# Patient Record
Sex: Male | Born: 1980 | Hispanic: Yes | Marital: Single
Health system: Southern US, Community
[De-identification: ages and names within clinical notes are randomized; demographics above are authoritative.]

---

## 2006-12-05 ENCOUNTER — Ambulatory Visit: Payer: Self-pay

## 2020-05-23 ENCOUNTER — Emergency Department
Admission: EM | Admit: 2020-05-23 | Discharge: 2020-05-23 | Disposition: A | Discharge status: Home / Self Care | Payer: Self-pay | Attending: Emergency Medicine | Admitting: Emergency Medicine

## 2020-05-23 ENCOUNTER — Emergency Department: Payer: Self-pay

## 2020-05-23 ENCOUNTER — Other Ambulatory Visit: Payer: Self-pay

## 2020-05-23 DIAGNOSIS — S62650B Nondisplaced fracture of medial phalanx of right index finger, initial encounter for open fracture: Secondary | ICD-10-CM

## 2020-05-23 DIAGNOSIS — S62650A Nondisplaced fracture of medial phalanx of right index finger, initial encounter for closed fracture: Secondary | ICD-10-CM | POA: Insufficient documentation

## 2020-05-23 DIAGNOSIS — Z23 Encounter for immunization: Secondary | ICD-10-CM | POA: Insufficient documentation

## 2020-05-23 DIAGNOSIS — W260XXA Contact with knife, initial encounter: Secondary | ICD-10-CM | POA: Insufficient documentation

## 2020-05-23 MED ORDER — CEPHALEXIN 500 MG PO CAPS: 1000.0000 mg | ORAL_CAPSULE | Freq: Two times a day (BID) | ORAL | 0 refills | Status: AC

## 2020-05-23 MED ORDER — OXYCODONE-ACETAMINOPHEN 5-325 MG PO TABS
1.0000 | ORAL_TABLET | Freq: Once | ORAL | Status: AC
  Administered 2020-05-23: 1 via ORAL
  Filled 2020-05-23: qty 1

## 2020-05-23 MED ORDER — TETANUS-DIPHTH-ACELL PERTUSSIS 5-2.5-18.5 LF-MCG/0.5 IM SUSP
0.5000 mL | Freq: Once | INTRAMUSCULAR | Status: AC
  Administered 2020-05-23: 0.5 mL via INTRAMUSCULAR
  Filled 2020-05-23: qty 0.5

## 2020-05-23 MED ORDER — SULFAMETHOXAZOLE-TRIMETHOPRIM 800-160 MG PO TABS: 1.0000 | ORAL_TABLET | Freq: Two times a day (BID) | ORAL | 0 refills | Status: AC

## 2020-05-23 MED ORDER — OXYCODONE-ACETAMINOPHEN 5-325 MG PO TABS: 1.0000 | ORAL_TABLET | Freq: Four times a day (QID) | ORAL | 0 refills | Status: AC | PRN

## 2020-05-23 MED ORDER — LIDOCAINE HCL (PF) 1 % IJ SOLN
10.0000 mL | Freq: Once | INTRAMUSCULAR | Status: AC
  Administered 2020-05-23: 10 mL
  Filled 2020-05-23: qty 10

## 2020-05-23 NOTE — ED Triage Notes (Signed)
Pt with lac to right index finger with electrical saw. Lac noted with mod amount of bleeding at this time.

## 2020-05-23 NOTE — ED Provider Notes (Signed)
[facility] Emergency Department Provider Note  ____________________________________________  Time seen: Approximately 8:27 PM  I have reviewed the triage vital signs and the nursing notes.   HISTORY  Chief Complaint Laceration    HPI Jose Thomas is a 39 y.o. male who presents the emergency department complaining of a injury to the index finger of the right hand.  Patient states that he was using a table saw when he accidentally cut his finger.  Patient states that the saw turned off, however the blade was still spinning when it made contact with the finger.  Patient had difficulty controlling the bleeding prior to arrival.  No bleeding or clotting disorders.  Patient has not attempted to move the finger since the injury.  He is unsure of his last tetanus shot.  Patient denies any other injury or complaint.         No past medical history on file.  There are no problems to display for this patient.     Prior to Admission medications   Medication Sig Start Date End Date Taking? Authorizing Provider  cephALEXin (KEFLEX) 500 MG capsule Take 2 capsules (1,000 mg total) by mouth 2 (two) times daily. 05/23/20   Cato Liburd, Delorise RoyalsJonathan D, PA-C  oxyCODONE-acetaminophen (PERCOCET/ROXICET) 5-325 MG tablet Take 1 tablet by mouth every 6 (six) hours as needed for severe pain. 05/23/20   Raynesha Tiedt, Delorise RoyalsJonathan D, PA-C  sulfamethoxazole-trimethoprim (BACTRIM DS) 800-160 MG tablet Take 1 tablet by mouth 2 (two) times daily. 05/23/20   Galen Malkowski, Delorise RoyalsJonathan D, PA-C    Allergies Patient has no allergy information on record.  No family history on file.  Social History Social History   Tobacco Use  . Smoking status: Not on file  Substance Use Topics  . Alcohol use: Not on file  . Drug use: Not on file     Review of Systems  Constitutional: No fever/chills Eyes: No visual changes. No discharge ENT: No upper respiratory complaints. Cardiovascular: no chest  pain. Respiratory: no cough. No SOB. Gastrointestinal: No abdominal pain.  No nausea, no vomiting.  No diarrhea.  No constipation. Musculoskeletal: Table saw injury to the index finger of the right hand Skin: Negative for rash, abrasions, lacerations, ecchymosis. Neurological: Negative for headaches, focal weakness or numbness. 10-point ROS otherwise negative.  ____________________________________________   PHYSICAL EXAM:  VITAL SIGNS: ED Triage Vitals  Enc Vitals Group     BP 05/23/20 2014 131/83     Pulse Rate 05/23/20 2014 70     Resp 05/23/20 2014 20     Temp 05/23/20 2014 98.5 F (36.9 C)     Temp src --      SpO2 05/23/20 2014 100 %     Weight 05/23/20 2018 150 lb (68 kg)     Height --      Head Circumference --      Peak Flow --      Pain Score 05/23/20 2018 8     Pain Loc --      Pain Edu? --      Excl. in GC? --      Constitutional: Alert and oriented. Well appearing and in no acute distress. Eyes: Conjunctivae are normal. PERRL. EOMI. Head: Atraumatic. ENT:      Ears:       Nose: No congestion/rhinnorhea.      Mouth/Throat: Mucous membranes are moist.  Neck: No stridor.    Cardiovascular: Normal rate, regular rhythm. Normal S1 and S2.  Good peripheral circulation. Respiratory:  Normal respiratory effort without tachypnea or retractions. Lungs CTAB. Good air entry to the bases with no decreased or absent breath sounds. Musculoskeletal: Full range of motion to all extremities. No gross deformities appreciated.  Visualization of the index finger of the right hand reveals ragged laceration that extends from the palmar to dorsal aspect of the finger.  This wraps around the lateral aspect of the finger.  Measures approximately 4 cm in length.  Bleeding is not controlled at this time.  Sensation intact.  No other visible injuries to the right hand.  Patient has superficial lacerations distal to main laceration as well as some small injury to the fingernail.  No loosening  of the fingernail at this time.  Exam after anesthesia reveals good extension and flexion of the digit. Neurologic:  Normal speech and language. No gross focal neurologic deficits are appreciated.  Skin:  Skin is warm, dry and intact. No rash noted. Psychiatric: Mood and affect are normal. Speech and behavior are normal. Patient exhibits appropriate insight and judgement.   ____________________________________________   LABS (all labs ordered are listed, but only abnormal results are displayed)  Labs Reviewed - No data to display ____________________________________________  EKG   ____________________________________________  RADIOLOGY I personally viewed and evaluated these images as part of my medical decision making, as well as reviewing the written report by the radiologist.  DG Finger Index Right  Result Date: 05/23/2020 CLINICAL DATA:  Status post trauma. EXAM: RIGHT INDEX FINGER 2+V COMPARISON:  None. FINDINGS: An acute fracture is seen involving the distal aspect of the middle phalanx of the second right finger. Lateral and volar displacement of a 5 mm fracture fragment is noted. There is no evidence of dislocation. An ill-defined soft tissue defect is seen along the lateral aspect of the distal right finger, adjacent to the previously noted fracture site. IMPRESSION: Acute fracture of the middle phalanx of the second right finger. Electronically Signed   By: Aram Candela M.D.   On: 05/23/2020 20:39    ____________________________________________    PROCEDURES  Procedure(s) performed:    Marland KitchenMarland KitchenLaceration Repair  Date/Time: 05/23/2020 10:28 PM Performed by: Racheal Patches, PA-C Authorized by: Racheal Patches, PA-C   Consent:    Consent obtained:  Verbal   Consent given by:  Patient   Risks discussed:  Pain, poor cosmetic result, poor wound healing, tendon damage, retained foreign body, need for additional repair, infection and nerve damage Anesthesia  (see MAR for exact dosages):    Anesthesia method:  Nerve block   Block location:  Index finger R hand   Block needle gauge:  27 G   Block anesthetic:  Lidocaine 1% w/o epi   Block technique:  Digital block   Block injection procedure:  Anatomic landmarks identified, introduced needle, negative aspiration for blood and incremental injection   Block outcome:  Anesthesia achieved Laceration details:    Location:  Finger   Finger location:  R index finger   Length (cm):  4 Repair type:    Repair type:  Complex Pre-procedure details:    Preparation:  Patient was prepped and draped in usual sterile fashion and imaging obtained to evaluate for foreign bodies Exploration:    Limited defect created (wound extended): yes     Hemostasis achieved with:  Tourniquet   Wound exploration: wound explored through full range of motion     Wound extent: underlying fracture     Wound extent: no foreign bodies/material noted, no muscle damage noted, no nerve  damage noted and no tendon damage noted     Contaminated: no   Treatment:    Area cleansed with:  Betadine and saline   Amount of cleaning:  Extensive   Irrigation solution:  Sterile saline   Irrigation volume:  1L   Irrigation method:  Syringe Skin repair:    Repair method:  Sutures   Suture size:  4-0   Suture material:  Nylon   Suture technique:  Running locked   Number of sutures:  1 Approximation:    Approximation:  Close Post-procedure details:    Dressing:  Splint for protection   Patient tolerance of procedure:  Tolerated well, no immediate complications Comments:     Edges of laceration were very ragged in nature and required trimming of ragged pieces of skin.  Running interlock suture placed with good approximation of wound.  Patient retains flexion and extension after procedure.  Sensation was present before digital block.  Unable to test sensation due to block after procedure.  Patient had finger tourniquet placed to assist in  cessation of bleeding.  After procedure tourniquet is removed, no return of bleeding.  Patient has good capillary refill after procedure.      Medications  Tdap (BOOSTRIX) injection 0.5 mL (0.5 mLs Intramuscular Given 05/23/20 2100)  oxyCODONE-acetaminophen (PERCOCET/ROXICET) 5-325 MG per tablet 1 tablet (1 tablet Oral Given 05/23/20 2059)  lidocaine (PF) (XYLOCAINE) 1 % injection 10 mL (10 mLs Infiltration Given 05/23/20 2102)     ____________________________________________   INITIAL IMPRESSION / ASSESSMENT AND PLAN / ED COURSE  Pertinent labs & imaging results that were available during my care of the patient were reviewed by me and considered in my medical decision making (see chart for details).  Review of the [place] CSRS was performed in accordance of the [facility] prior to dispensing any controlled drugs.           Patient's diagnosis is consistent with open fracture of the index finger.  Patient presented to the emergency department after sustaining an injury to the index finger from a table saw.  Tablesaw made contact with the bone causing a fracture.  Patient has good extension and flexion of the digit however.  No indication for ligament rupture/complete severing.  Patient had laceration sutured, splinted here in the emergency department as described above.  At this time we will place the patient on dual antibiotic coverage, pain medications and refer to hand surgery for ongoing management of open fracture.  I discussed at length return precautions.  Patient verbalizes understanding of same.  Patient will be discharged home with prescriptions for Keflex, Bactrim, Percocet. Patient is to follow up with hand surgery as needed or otherwise directed. Patient is given ED precautions to return to the ED for any worsening or new symptoms.     ____________________________________________  FINAL CLINICAL IMPRESSION(S) / ED DIAGNOSES  Final diagnoses:  Open nondisplaced fracture of middle  phalanx of right index finger, initial encounter      NEW MEDICATIONS STARTED DURING THIS VISIT:  ED Discharge Orders         Ordered    cephALEXin (KEFLEX) 500 MG capsule  2 times daily        05/23/20 2227    sulfamethoxazole-trimethoprim (BACTRIM DS) 800-160 MG tablet  2 times daily        05/23/20 2227    oxyCODONE-acetaminophen (PERCOCET/ROXICET) 5-325 MG tablet  Every 6 hours PRN        05/23/20 2227  This chart was dictated using voice recognition software/Dragon. Despite best efforts to proofread, errors can occur which can change the meaning. Any change was purely unintentional.    Racheal Patches, PA-C 05/23/20 2243    Minna Antis, MD 05/23/20 2326

## 2021-11-02 IMAGING — DX DG FINGER INDEX 2+V*R*
3 series · 3 of 3 positions shown · non-contrast
Comparison: None.

CLINICAL DATA: Status post trauma.

EXAM:
RIGHT INDEX FINGER 2+V

[finger ap]
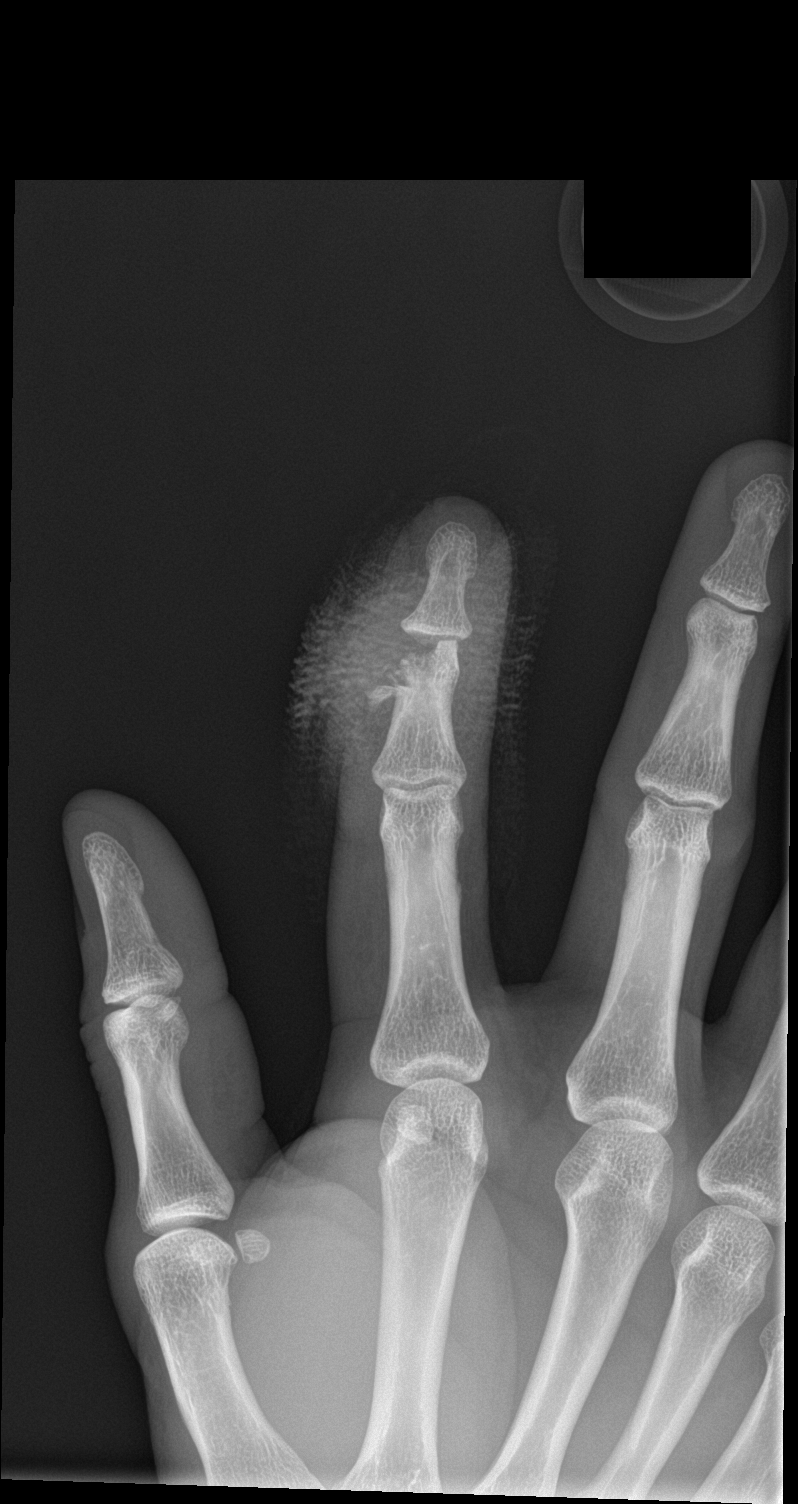

[finger obl]
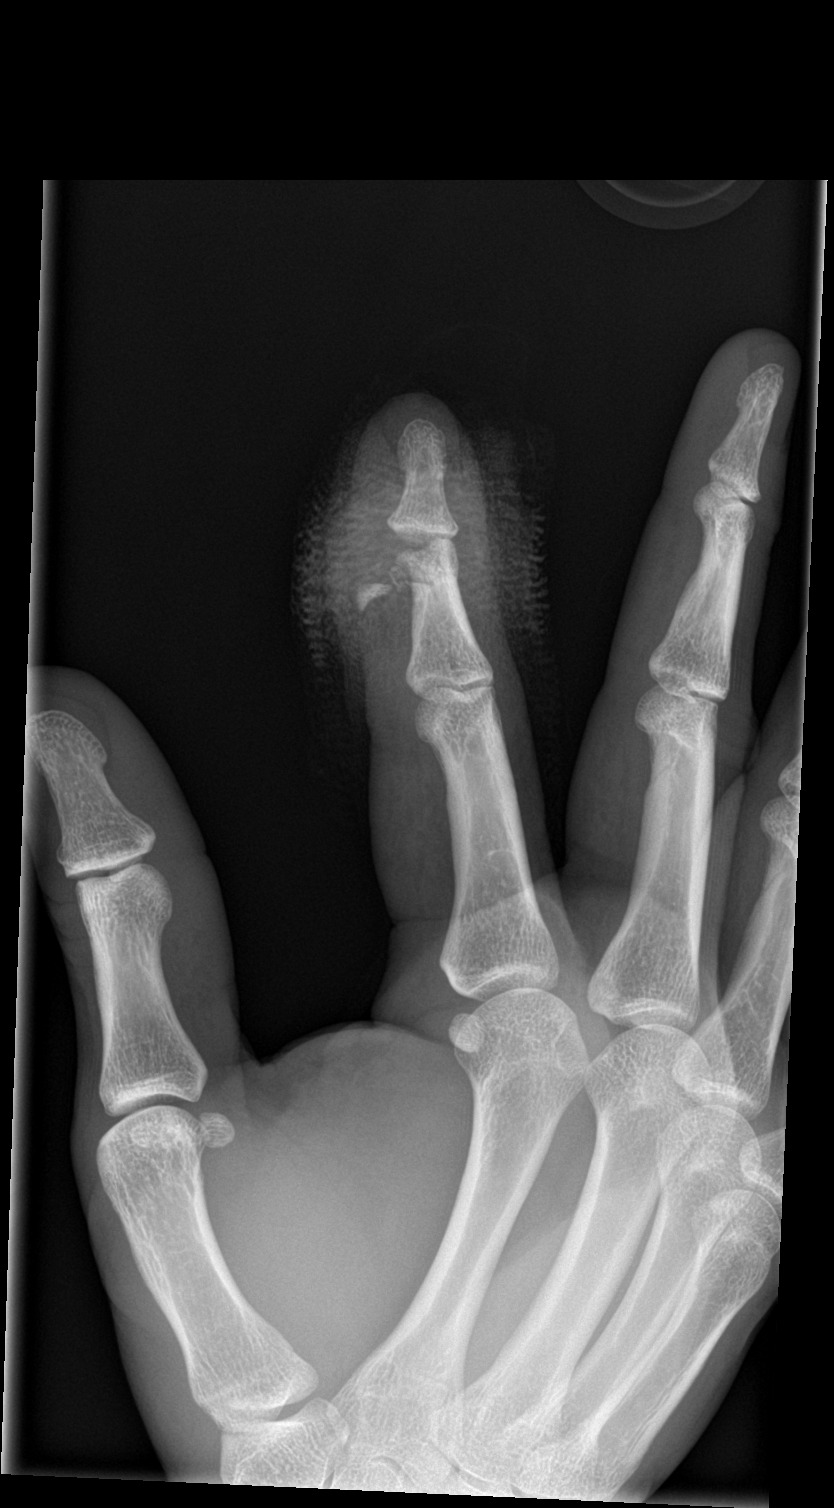

[finger lat]
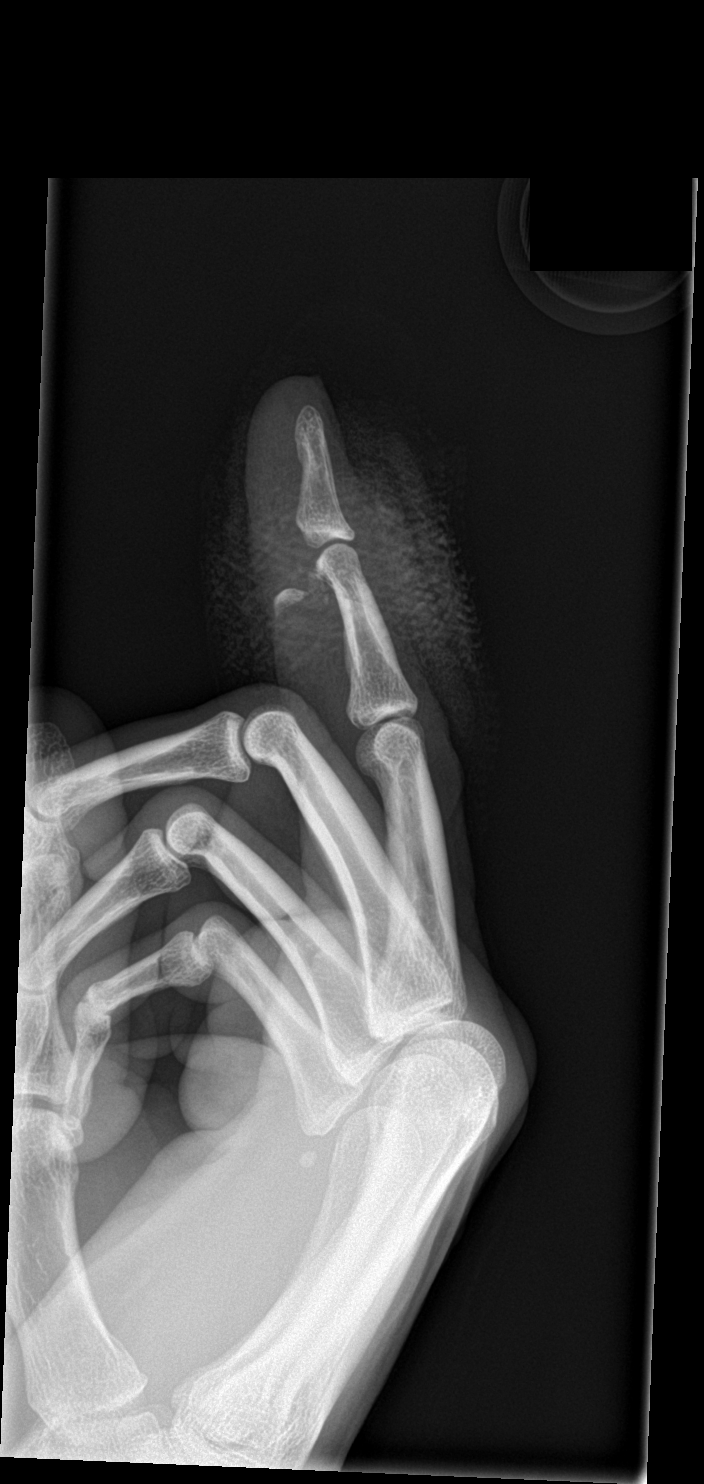

[3 of 3 positions shown; findings below may reference images not displayed]

FINDINGS: An acute fracture is seen involving the distal aspect of the middle
phalanx of the second right finger. Lateral and volar displacement
of a 5 mm fracture fragment is noted. There is no evidence of
dislocation. An ill-defined soft tissue defect is seen along the
lateral aspect of the distal right finger, adjacent to the
previously noted fracture site.
IMPRESSION: Acute fracture of the middle phalanx of the second right finger.

## 2023-08-05 ENCOUNTER — Ambulatory Visit (LOCAL_COMMUNITY_HEALTH_CENTER): Payer: Self-pay

## 2023-08-05 DIAGNOSIS — Z719 Counseling, unspecified: Secondary | ICD-10-CM

## 2023-08-05 NOTE — Progress Notes (Signed)
In nurse clinic for immunizations needed for immigration. Patient requesting [employer], MMR, Varicella, and Polio. Voices no concerns. VIS reviewed and given to patient. Patient already paid for private Polio vaccine. Patient eligible for state-supplied MMR and Covid vaccine. Patient provided paystubs for [employer] Patient [employer] Program for Varicella vaccine. As hearing back from [employer] took a long time, Charity fundraiser and patient agreed that patient would come back tomorrow for vaccines. Patient scheduled for vaccines 08/06/2023 at 4PM to receive P Polio, P Varicella, S MMR, and S [employer].   Abagail Kitchens, RN

## 2023-08-06 ENCOUNTER — Ambulatory Visit: Payer: Self-pay

## 2023-08-06 DIAGNOSIS — Z23 Encounter for immunization: Secondary | ICD-10-CM

## 2023-08-06 DIAGNOSIS — Z719 Counseling, unspecified: Secondary | ICD-10-CM

## 2023-08-06 NOTE — Progress Notes (Signed)
Patient returned to nurse clinic today for immunizations as needed for immigration. Patient seen yesterday in nurse clinic.  Patient has no health insurance  Immunizations given today: [employer] [phone] (26yrs+): meets criteria for state supply MMR - meets criteria for state supplied vaccine Polio - paid for vaccine yesterday Varivax- approval received from [employer] Patient [employer] Program  for vaccine and no charge to patient.   Fax regarding Varivax administration info successfully faxed to [employer] Patient Assistance program.   Tolerated vaccines well today. Updated [facility] copy given and explained. Declined to stay for 15 min observation. Jerel Shepherd, RN
